# Patient Record
Sex: Female | Born: 2005 | Race: White | Hispanic: No | Marital: Single | Smoking: Never smoker
Health system: Southern US, Community
[De-identification: ages and names within clinical notes are randomized; demographics above are authoritative.]

## PROBLEM LIST (undated history)

## (undated) DIAGNOSIS — Z8639 Personal history of other endocrine, nutritional and metabolic disease: Secondary | ICD-10-CM

## (undated) DIAGNOSIS — F329 Major depressive disorder, single episode, unspecified: Secondary | ICD-10-CM

## (undated) DIAGNOSIS — Z87440 Personal history of urinary (tract) infections: Secondary | ICD-10-CM

## (undated) DIAGNOSIS — F909 Attention-deficit hyperactivity disorder, unspecified type: Secondary | ICD-10-CM

## (undated) DIAGNOSIS — F419 Anxiety disorder, unspecified: Secondary | ICD-10-CM

## (undated) DIAGNOSIS — Z87448 Personal history of other diseases of urinary system: Secondary | ICD-10-CM

## (undated) DIAGNOSIS — M25561 Pain in right knee: Secondary | ICD-10-CM

## (undated) HISTORY — DX: Pain in right knee: M25.561

## (undated) HISTORY — DX: Attention-deficit hyperactivity disorder, unspecified type: F90.9

## (undated) HISTORY — DX: Anxiety disorder, unspecified: F41.9

## (undated) HISTORY — DX: Personal history of urinary (tract) infections: Z87.440

## (undated) HISTORY — DX: Personal history of other endocrine, nutritional and metabolic disease: Z86.39

## (undated) HISTORY — DX: Personal history of other diseases of urinary system: Z87.448

---
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
## 2025-04-26 ENCOUNTER — Ambulatory Visit: Payer: Self-pay | Admitting: Physician Assistant
